# Patient Record
Sex: Male | Born: 1970 | Race: Black or African American | Hispanic: No | Marital: Married | Smoking: Never smoker
Health system: Southern US, Community
[De-identification: ages and names within clinical notes are randomized; demographics above are authoritative.]

---

## 2021-05-10 ENCOUNTER — Encounter (HOSPITAL_BASED_OUTPATIENT_CLINIC_OR_DEPARTMENT_OTHER): Payer: Self-pay | Admitting: Emergency Medicine

## 2021-05-10 ENCOUNTER — Emergency Department (HOSPITAL_BASED_OUTPATIENT_CLINIC_OR_DEPARTMENT_OTHER)
Admission: EM | Admit: 2021-05-10 | Discharge: 2021-05-10 | Disposition: A | Discharge status: Home / Self Care | Payer: BC Managed Care – PPO | Attending: Emergency Medicine | Admitting: Emergency Medicine

## 2021-05-10 ENCOUNTER — Emergency Department (HOSPITAL_BASED_OUTPATIENT_CLINIC_OR_DEPARTMENT_OTHER): Payer: BC Managed Care – PPO

## 2021-05-10 ENCOUNTER — Other Ambulatory Visit: Payer: Self-pay

## 2021-05-10 DIAGNOSIS — L538 Other specified erythematous conditions: Secondary | ICD-10-CM | POA: Diagnosis not present

## 2021-05-10 DIAGNOSIS — J029 Acute pharyngitis, unspecified: Secondary | ICD-10-CM

## 2021-05-10 DIAGNOSIS — Z20822 Contact with and (suspected) exposure to covid-19: Secondary | ICD-10-CM | POA: Insufficient documentation

## 2021-05-10 DIAGNOSIS — J069 Acute upper respiratory infection, unspecified: Secondary | ICD-10-CM | POA: Diagnosis not present

## 2021-05-10 DIAGNOSIS — R059 Cough, unspecified: Secondary | ICD-10-CM | POA: Diagnosis present

## 2021-05-10 DIAGNOSIS — R000 Tachycardia, unspecified: Secondary | ICD-10-CM | POA: Insufficient documentation

## 2021-05-10 LAB — RESP PANEL BY RT-PCR (FLU A&B, COVID) ARPGX2
Influenza A by PCR: NEGATIVE
Influenza B by PCR: NEGATIVE
SARS Coronavirus 2 by RT PCR: NEGATIVE

## 2021-05-10 LAB — GROUP A STREP BY PCR: Group A Strep by PCR: NOT DETECTED

## 2021-05-10 MED ORDER — IBUPROFEN 800 MG PO TABS
800.0000 mg | ORAL_TABLET | Freq: Once | ORAL | Status: AC
  Administered 2021-05-10: 800 mg via ORAL
  Filled 2021-05-10: qty 1

## 2021-05-10 MED ORDER — ONDANSETRON 4 MG PO TBDP
4.0000 mg | ORAL_TABLET | Freq: Once | ORAL | Status: AC
  Administered 2021-05-10: 4 mg via ORAL
  Filled 2021-05-10: qty 1

## 2021-05-10 MED ORDER — ACETAMINOPHEN 500 MG PO TABS
1000.0000 mg | ORAL_TABLET | Freq: Once | ORAL | Status: AC
  Administered 2021-05-10: 1000 mg via ORAL
  Filled 2021-05-10: qty 2

## 2021-05-10 MED ORDER — AMOXICILLIN 500 MG PO CAPS: 500.0000 mg | ORAL_CAPSULE | Freq: Two times a day (BID) | ORAL | 0 refills | Status: AC

## 2021-05-10 NOTE — Discharge Instructions (Addendum)
Chest x-ray shows no pneumonia.  Overall suspect a viral process.  You have been tested for COVID, influenza and strep throat.  If your strep pharyngitis test is positive please fill antibiotic.  Otherwise recommend ibuprofen and Tylenol as needed for body aches and fever.  Stay hydrated.  Please return if symptoms worsen.

## 2021-05-10 NOTE — ED Provider Notes (Signed)
[facility] EMERGENCY DEPT Provider Note   CSN: [id number] Arrival date & time: 05/10/21  1154     History Chief Complaint  Patient presents with  . Cough    Bruce Rogers is a 50 y.o. male.  The history is provided by the patient.  Cough Cough characteristics:  Non-productive Sputum characteristics:  Nondescript Severity:  Moderate Onset quality:  Gradual Duration:  2 days Timing:  Intermittent Progression:  Waxing and waning Chronicity:  New Smoker: yes   Context: upper respiratory infection   Relieved by:  Nothing Worsened by:  Nothing Associated symptoms: chills, fever, headaches, myalgias, sinus congestion and sore throat   Associated symptoms: no chest pain, no ear pain, no rash and no shortness of breath       History reviewed. No pertinent past medical history.  There are no problems to display for this patient.      No family history on file.  Social History   Tobacco Use  . Smoking status: Never  . Smokeless tobacco: Never  Substance Use Topics  . Alcohol use: Yes    Home Medications Prior to Admission medications   Medication Sig Start Date End Date Taking? Authorizing Provider  amoxicillin (AMOXIL) 500 MG capsule Take 1 capsule (500 mg total) by mouth 2 (two) times daily for 10 days. 05/10/21 05/20/21 Yes Jemario Poitras, DO    Allergies    Patient has no known allergies.  Review of Systems   Review of Systems  Constitutional:  Positive for chills and fever.  HENT:  Positive for sinus pain and sore throat. Negative for dental problem, drooling, ear pain and trouble swallowing.   Eyes:  Negative for pain and visual disturbance.  Respiratory:  Positive for cough. Negative for shortness of breath.   Cardiovascular:  Negative for chest pain and palpitations.  Gastrointestinal:  Positive for nausea and vomiting. Negative for abdominal pain.  Genitourinary:  Negative for dysuria and hematuria.  Musculoskeletal:  Positive for  myalgias. Negative for arthralgias and back pain.  Skin:  Negative for color change and rash.  Neurological:  Positive for headaches. Negative for seizures and syncope.  All other systems reviewed and are negative.  Physical Exam Updated Vital Signs  ED Triage Vitals  Enc Vitals Group     BP 05/10/21 1200 (!) 149/63     Pulse Rate 05/10/21 1200 (!) 125     Resp 05/10/21 1200 (!) 24     Temp 05/10/21 1200 (!) 100.6 F (38.1 C)     Temp Source 05/10/21 1200 Oral     SpO2 05/10/21 1200 100 %     Weight 05/10/21 1158 275 lb (124.7 kg)     Height 05/10/21 1158 6\' 6"  (1.981 m)     Head Circumference --      Peak Flow --      Pain Score 05/10/21 1158 8     Pain Loc --      Pain Edu? --      Excl. in GC? --     Physical Exam Vitals and nursing note reviewed.  Constitutional:      General: He is not in acute distress.    Appearance: He is well-developed. He is not ill-appearing.  HENT:     Head: Normocephalic and atraumatic.     Nose: Nose normal.     Mouth/Throat:     Mouth: Mucous membranes are moist.     Pharynx: Posterior oropharyngeal erythema present. No oropharyngeal exudate.  Eyes:  Extraocular Movements: Extraocular movements intact.     Conjunctiva/sclera: Conjunctivae normal.     Pupils: Pupils are equal, round, and reactive to light.  Cardiovascular:     Rate and Rhythm: Regular rhythm. Tachycardia present.     Heart sounds: No murmur heard. Pulmonary:     Effort: Pulmonary effort is normal. No respiratory distress.     Breath sounds: Normal breath sounds.  Abdominal:     Palpations: Abdomen is soft.     Tenderness: There is no abdominal tenderness.  Musculoskeletal:        General: Normal range of motion.     Cervical back: Normal range of motion and neck supple.  Skin:    General: Skin is warm and dry.  Neurological:     General: No focal deficit present.     Mental Status: He is alert and oriented to person, place, and time.     Cranial Nerves: No  cranial nerve deficit.     Sensory: No sensory deficit.     Motor: No weakness.     Coordination: Coordination normal.    ED Results / Procedures / Treatments   Labs (all labs ordered are listed, but only abnormal results are displayed) Labs Reviewed  RESP PANEL BY RT-PCR (FLU A&B, COVID) [id number]  GROUP A STREP BY PCR    EKG None  Radiology DG Chest Portable 1 View  Result Date: 05/10/2021 CLINICAL DATA:  HX covid July 2022, former smoker. No ca hx. C/o cough, sob and CP TKV EXAM: PORTABLE CHEST - 1 VIEW COMPARISON:  none FINDINGS: Lungs are clear. Heart size and mediastinal contours are within normal limits. No effusion.  No pneumothorax. Visualized bones unremarkable. IMPRESSION: No acute cardiopulmonary disease. Electronically Signed   By: Corlis Leak M.D.   On: 05/10/2021 12:33    Procedures Procedures   Medications Ordered in ED Medications  acetaminophen (TYLENOL) tablet 1,000 mg (1,000 mg Oral Given 05/10/21 1222)  ondansetron (ZOFRAN-ODT) disintegrating tablet 4 mg (4 mg Oral Given 05/10/21 1223)  ibuprofen (ADVIL) tablet 800 mg (800 mg Oral Given 05/10/21 1223)    ED Course  I have reviewed the triage vital signs and the nursing notes.  Pertinent labs & imaging results that were available during my care of the patient were reviewed by me and considered in my medical decision making (see chart for details).    MDM Rules/Calculators/A&P                           Bruce Rogers is here with fever, body aches, sore throat.  Negative home COVID test.  Arrives febrile mildly tachycardic.  Overall appears well.  Does have some erythema to the back of his throat but no signs of abscess.  Appears well otherwise.  Chest x-ray with no evidence of pneumonia.  Overall suspect viral process or may be strep throat.  Will prescribe him antibiotic for strep pharyngitis if his test is positive but he prefers to follow-up test on his MyChart.  Recommend that he fill antibiotic if he is positive  for strep throat.  Overall suspect a viral process.  Understands return precautions.  Recommend Tylenol and ibuprofen.  Discharged in the ED in good condition.  Understands return precautions.  This chart was dictated using voice recognition software.  Despite best efforts to proofread,  errors can occur which can change the documentation meaning.   Final Clinical Impression(s) / ED Diagnoses Final diagnoses:  Upper respiratory  tract infection, unspecified type  Sore throat    Rx / DC Orders ED Discharge Orders          Ordered    amoxicillin (AMOXIL) 500 MG capsule  2 times daily        05/10/21 1240             [place], Madelaine Bhat, DO 05/10/21 1241

## 2021-05-10 NOTE — ED Triage Notes (Signed)
Pt reports cough, headache, chills, sore throat x 2 days. Endorses vomiting last night.

## 2022-10-16 IMAGING — DX DG CHEST 1V PORT
1 series · 1 of 1 positions shown · non-contrast
Comparison: none

CLINICAL DATA: HX covid April 2021, former smoker. No ca hx. C/o
cough, sob and CP TKV

EXAM:
PORTABLE CHEST - 1 VIEW

[chest]
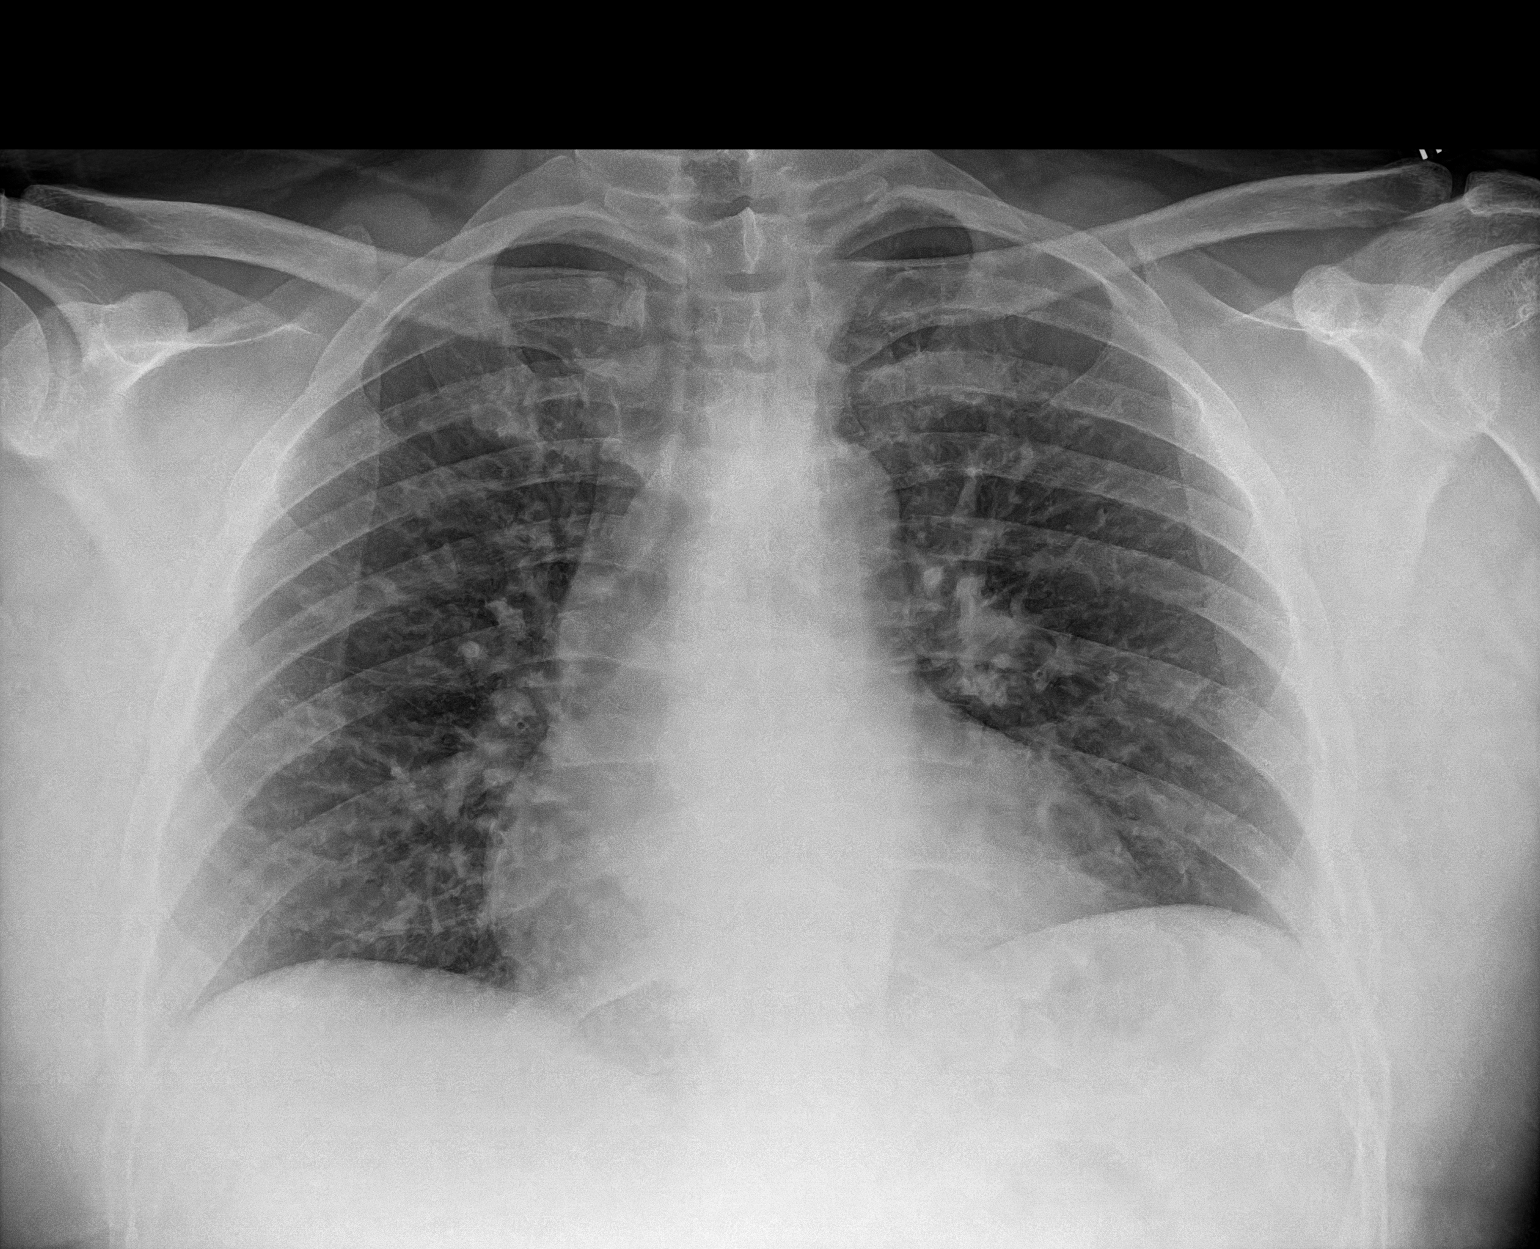

[1 of 1 positions shown; findings below may reference images not displayed]

FINDINGS: Lungs are clear.

Heart size and mediastinal contours are within normal limits.

No effusion.  No pneumothorax.

Visualized bones unremarkable.
IMPRESSION: No acute cardiopulmonary disease.
